# Patient Record
Sex: Female | Born: 1995 | Race: White | Hispanic: No | Marital: Single | Smoking: Never smoker
Health system: Southern US, Community
[De-identification: ages and names within clinical notes are randomized; demographics above are authoritative.]

## PROBLEM LIST (undated history)

## (undated) DIAGNOSIS — N63 Unspecified lump in unspecified breast: Secondary | ICD-10-CM

## (undated) HISTORY — PX: OTHER SURGICAL HISTORY: SHX169

---

## 2020-06-30 ENCOUNTER — Other Ambulatory Visit: Payer: Self-pay | Admitting: Obstetrics and Gynecology

## 2020-06-30 DIAGNOSIS — N6310 Unspecified lump in the right breast, unspecified quadrant: Secondary | ICD-10-CM

## 2020-07-13 ENCOUNTER — Other Ambulatory Visit: Payer: Self-pay

## 2020-07-13 ENCOUNTER — Ambulatory Visit
Admission: RE | Admit: 2020-07-13 | Discharge: 2020-07-13 | Disposition: A | Discharge status: Home / Self Care | Payer: Self-pay | Source: Ambulatory Visit | Attending: Obstetrics and Gynecology | Admitting: Obstetrics and Gynecology

## 2020-07-13 ENCOUNTER — Other Ambulatory Visit: Payer: Self-pay | Admitting: Obstetrics and Gynecology

## 2020-07-13 DIAGNOSIS — N6310 Unspecified lump in the right breast, unspecified quadrant: Secondary | ICD-10-CM

## 2021-01-07 ENCOUNTER — Other Ambulatory Visit: Payer: Self-pay | Admitting: Obstetrics and Gynecology

## 2021-01-07 DIAGNOSIS — N6310 Unspecified lump in the right breast, unspecified quadrant: Secondary | ICD-10-CM

## 2021-01-11 ENCOUNTER — Other Ambulatory Visit: Payer: Self-pay | Admitting: Obstetrics and Gynecology

## 2021-01-11 ENCOUNTER — Other Ambulatory Visit: Payer: Self-pay

## 2021-01-11 ENCOUNTER — Ambulatory Visit
Admission: RE | Admit: 2021-01-11 | Discharge: 2021-01-11 | Disposition: A | Discharge status: Home / Self Care | Payer: BC Managed Care – PPO | Source: Ambulatory Visit | Attending: Obstetrics and Gynecology | Admitting: Obstetrics and Gynecology

## 2021-01-11 DIAGNOSIS — N6310 Unspecified lump in the right breast, unspecified quadrant: Secondary | ICD-10-CM

## 2021-01-18 ENCOUNTER — Other Ambulatory Visit: Payer: Self-pay | Admitting: Obstetrics and Gynecology

## 2021-01-18 ENCOUNTER — Ambulatory Visit
Admission: RE | Admit: 2021-01-18 | Discharge: 2021-01-18 | Disposition: A | Discharge status: Home / Self Care | Payer: BC Managed Care – PPO | Source: Ambulatory Visit | Attending: Obstetrics and Gynecology | Admitting: Obstetrics and Gynecology

## 2021-01-18 ENCOUNTER — Other Ambulatory Visit: Payer: Self-pay

## 2021-01-18 DIAGNOSIS — N6310 Unspecified lump in the right breast, unspecified quadrant: Secondary | ICD-10-CM

## 2022-09-12 ENCOUNTER — Encounter: Payer: Self-pay | Admitting: Physical Therapy

## 2022-09-12 ENCOUNTER — Ambulatory Visit: Payer: BC Managed Care – PPO | Attending: Obstetrics and Gynecology | Admitting: Physical Therapy

## 2022-09-12 ENCOUNTER — Other Ambulatory Visit: Payer: Self-pay

## 2022-09-12 DIAGNOSIS — M62838 Other muscle spasm: Secondary | ICD-10-CM | POA: Diagnosis present

## 2022-09-12 DIAGNOSIS — M6281 Muscle weakness (generalized): Secondary | ICD-10-CM

## 2022-09-12 NOTE — Therapy (Addendum)
OUTPATIENT PHYSICAL THERAPY FEMALE PELVIC EVALUATION   Patient Name: Ashley Copeland MRN: [id number] DOB:March 20, 1996, 27 y.o., female Today's Date: 09/12/2022  END OF SESSION:  PT End of Session - 09/12/22 1800     Visit Number 1    Date for PT Re-Evaluation 12/05/22    Authorization Type BCBS    PT Start Time 1020    PT Stop Time 1058    PT Time Calculation (min) 38 min    Activity Tolerance Patient tolerated treatment well    Behavior During Therapy WFL for tasks assessed/performed             History reviewed. No pertinent past medical history. Past Surgical History:  Procedure Laterality Date   ureter surgery     from pt report   There are no problems to display for this patient.   PCP: Gara Kroner, DO   REFERRING PROVIDER: Tona Sensing, FNP   REFERRING DIAG: N94.10 (ICD-10-CM) - Unspecified dyspareunia   THERAPY DIAG:  Other muscle spasm  Muscle weakness (generalized)  Rationale for Evaluation and Treatment: Rehabilitation  ONSET DATE: years  SUBJECTIVE:                                                                                                                                                                                           SUBJECTIVE STATEMENT: Pretty much only having pain with intercourse and has been since I was a teen.  It doesn't happen every single time but usually have pain with intercourse and not sure why it happens. Fluid intake: Yes: a lot of water    PAIN:  Are you having pain? No NPRS scale: 7-8/10 Pain location: External, Deep, and Vaginal  Pain type: aching Pain description: intermittent   Aggravating factors: intercourse Relieving factors:   PRECAUTIONS: None  WEIGHT BEARING RESTRICTIONS: No  FALLS:  Has patient fallen in last 6 months? 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  PLOF: Independent  PATIENT GOALS: be able to  have intercourse without pain  PERTINENT HISTORY:  PMH: Surgery of ureter Sexual abuse: No  BOWEL MOVEMENT: Pain with bowel movement: No Type of bowel movement:Type (Bristol Stool Scale) normal, Frequency normal, and Strain No Fully empty rectum: Yes:     URINATION: Pain with urination: Yes Fully empty bladder: No recent feel like more frequently -last few months Stream: Strong Urgency: No Frequency: evenings when I lay down in bed sometimes have to get back up a few minutes later and feel like I have to keep going Leakage:  No Pads: No  INTERCOURSE: Pain with intercourse: Initial Penetration and During Penetration Ability to have vaginal penetration:  Yes:   Climax: able to have Marinoff Scale: 3/3 sometimes just don't want to  PREGNANCY: Nulliparous  PROLAPSE:    OBJECTIVE:   DIAGNOSTIC FINDINGS:    PATIENT SURVEYS:    PFIQ-7   COGNITION: Overall cognitive status: Within functional limits for tasks assessed     SENSATION: Light touch: Appears intact Proprioception: Appears intact  MUSCLE LENGTH: Hamstrings: Right 70 deg; Left 80 deg Thomas test:   LUMBAR SPECIAL TESTS:  ASLR negative  FUNCTIONAL TESTS:  Single leg stand Rt mild instability  GAIT:  Comments: WFL   POSTURE: rounded shoulders and increased thoracic kyphosis  PELVIC ALIGNMENT: normal  LUMBARAROM/PROM:  A/PROM A/PROM  eval  Flexion WFL   Extension   Right lateral flexion   Left lateral flexion   Right rotation   Left rotation    (Blank rows = not tested)  LOWER EXTREMITY ROM:  Passive ROM Right eval Left eval  Hip flexion 80%   Hip extension    Hip abduction    Hip adduction    Hip internal rotation 75% 100%  Hip external rotation 100% 100%  Knee flexion    Knee extension    Ankle dorsiflexion    Ankle plantarflexion    Ankle inversion    Ankle eversion     (Blank rows = not tested)  LOWER EXTREMITY MMT:  MMT Right eval Left eval  Hip flexion     Hip extension 4/5 4/5  Hip abduction 4/5 4/5  Hip adduction  4+/5  Hip internal rotation    Hip external rotation    Knee flexion    Knee extension    Ankle dorsiflexion    Ankle plantarflexion    Ankle inversion    Ankle eversion     PALPATION:   General  lumbar paraspinals and gluteals tight                External Perineal Exam elevated perineal body                             Internal Pelvic Floor high tone some tenderness Rt ileococcygeus and obturator, slow to relax and unable to relax all the way without heavy verbal and tactile cues  Patient confirms identification and approves PT to assess internal pelvic floor and treatment Yes  PELVIC MMT:   MMT eval  Vaginal 2/5 x 4 reps  Internal Anal Sphincter   External Anal Sphincter   Puborectalis   Diastasis Recti   (Blank rows = not tested)        TONE: high  PROLAPSE: no  TODAY'S TREATMENT:                                                                                                                              DATE: 09/12/22  EVAL and initial breathing and relaxing/bulging pelvic floor   PATIENT EDUCATION:  Education details:  Person educated: Patient Education method: Explanation Education comprehension: verbalized understanding  HOME EXERCISE PROGRAM: Not given due to slow internet connection  ASSESSMENT:  CLINICAL IMPRESSION: Patient is a 27 y.o. female who was seen today for physical therapy evaluation and treatment for dyspareunia.  Pt has difficulty relaxing the pelvic floor after contracting.  Pt can only contract 4 x with diminishing range of motion due to holding tension . Pt has tension in hamstrings, lumbar, gluteal muscles. Pt will benefit from skilled PT to address all above mentioned impairments for improved ability to manage pain during intercourse and not have pain during female wellness exams  OBJECTIVE IMPAIRMENTS: decreased coordination, decreased endurance, decreased ROM,  decreased strength, increased muscle spasms, impaired flexibility, and pain.   ACTIVITY LIMITATIONS:     PARTICIPATION LIMITATIONS: interpersonal relationship  PERSONAL FACTORS: Time since onset of injury/illness/exacerbation are also affecting patient's functional outcome.   REHAB POTENTIAL: Excellent  CLINICAL DECISION MAKING: Stable/uncomplicated  EVALUATION COMPLEXITY: Low   GOALS: Goals reviewed with patient? Yes    LONG TERM GOALS: Target date: 12/05/22  Pt will be independent with advanced HEP to maintain improvements made throughout therapy  Baseline:  Goal status: INITIAL  2.  Pt will report 1/3 Marinoff scale at most Baseline:  Goal status: INITIAL  3.  Pt will have at least one known way to significantly relax pelvic floor muscles after tightening in order to have low to zero pain during intercourse Baseline:  Goal status: INITIAL  4.  Pt will have 5/5 hip strength for improved pelvic stability during functional activities to prevent overuse of back and pelvic floor. Baseline:  Goal status: INITIAL  5.  Pt will have pain free intercourse at least 3/4 attempts Baseline:  Goal status: INITIAL    PLAN:  PT FREQUENCY: 1x/week  PT DURATION: 12 weeks  PLANNED INTERVENTIONS: Therapeutic exercises, Therapeutic activity, Neuromuscular re-education, Balance training, Gait training, Patient/Family education, Self Care, Joint mobilization, Dry Needling, Electrical stimulation, Cryotherapy, Moist heat, Taping, Biofeedback, Manual therapy, and Re-evaluation  PLAN FOR NEXT SESSION: biofeedback and education on dilators   Jule Ser, PT 09/12/2022, 6:02 PM  PHYSICAL THERAPY DISCHARGE SUMMARY  Visits from Start of Care: 1  Current functional level related to goals / functional outcomes: See above goals   Remaining deficits: See above   Education / Equipment: HEP   Patient agrees to discharge. Patient goals were not met. Patient is being discharged  due to financial reasons. Gustavus Bryant, PT, DPT 11/20/22 8:19 AM

## 2022-10-19 ENCOUNTER — Ambulatory Visit: Payer: BC Managed Care – PPO | Admitting: Physical Therapy

## 2022-10-27 ENCOUNTER — Ambulatory Visit: Payer: BC Managed Care – PPO | Admitting: Physical Therapy

## 2022-11-01 ENCOUNTER — Encounter: Payer: BC Managed Care – PPO | Admitting: Physical Therapy

## 2022-11-08 ENCOUNTER — Encounter: Payer: BC Managed Care – PPO | Admitting: Physical Therapy

## 2023-05-01 IMAGING — US US BREAST*R* LIMITED INC AXILLA
1 series · 9 of 9 positions shown · non-contrast
Comparison: 07/13/2020

CLINICAL DATA: Follow-up right breast probable benign fibroadenoma.

EXAM:
ULTRASOUND OF THE RIGHT BREAST

[Series 1: us breast*right* limited inc axilla · 0.06mm/px · 9 of 9 slices shown]
[im 1/9]
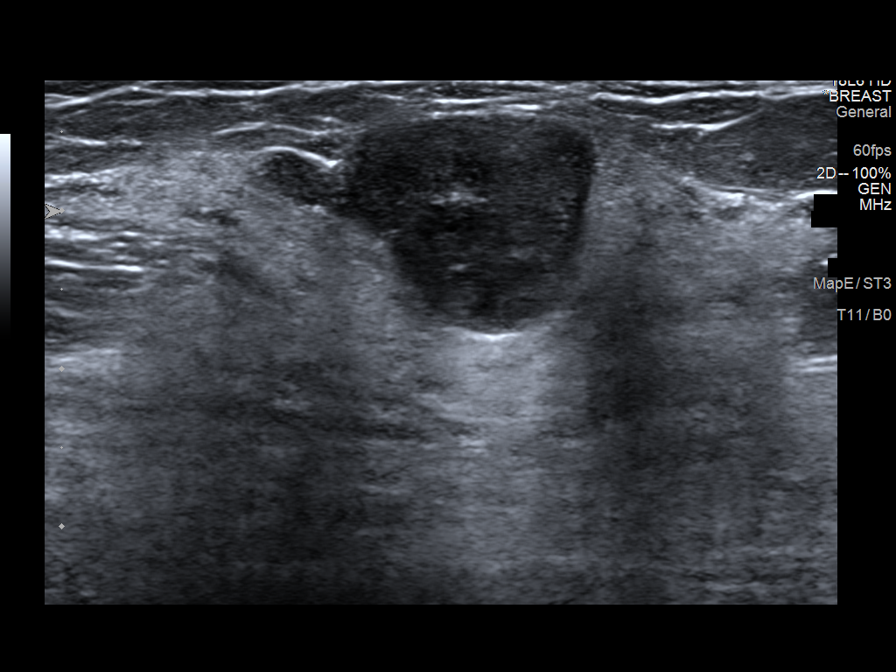
[im 2/9]
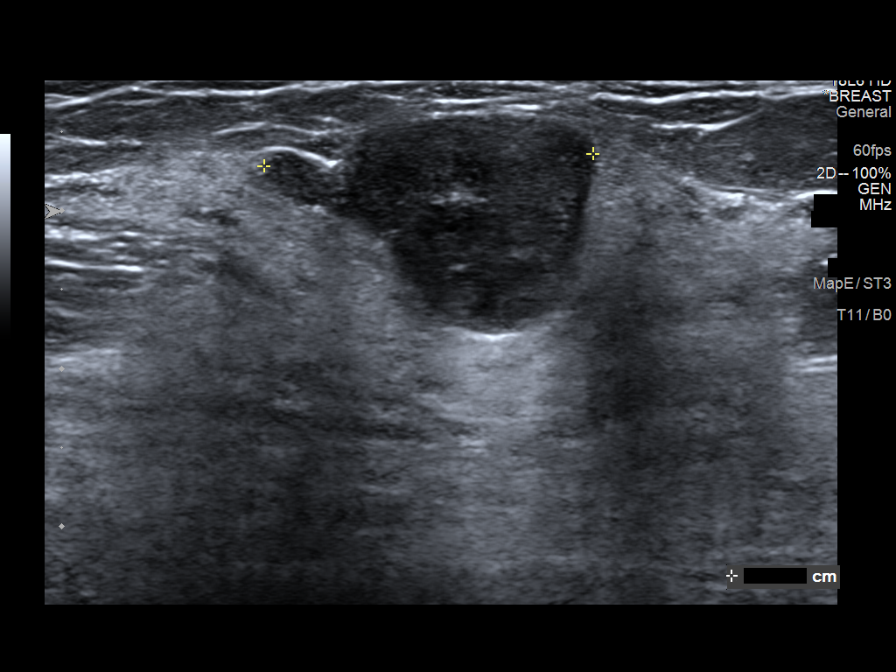
[im 3/9]
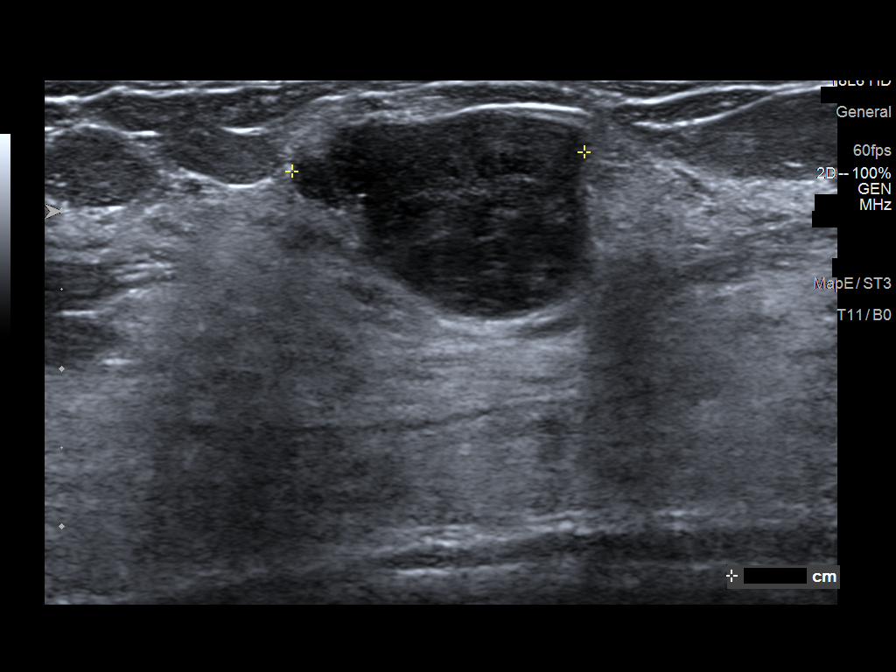
[im 4/9]
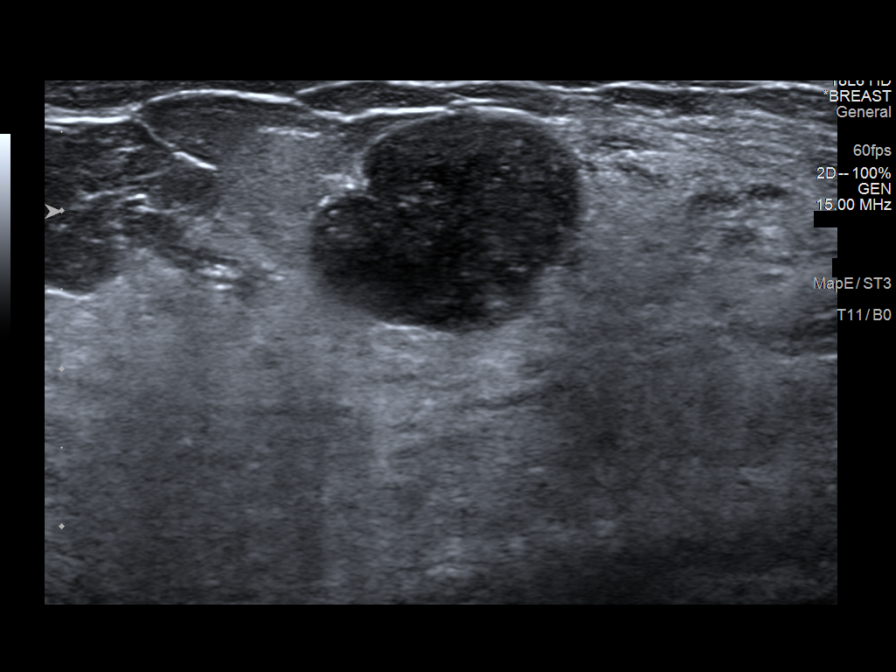
[im 5/9]
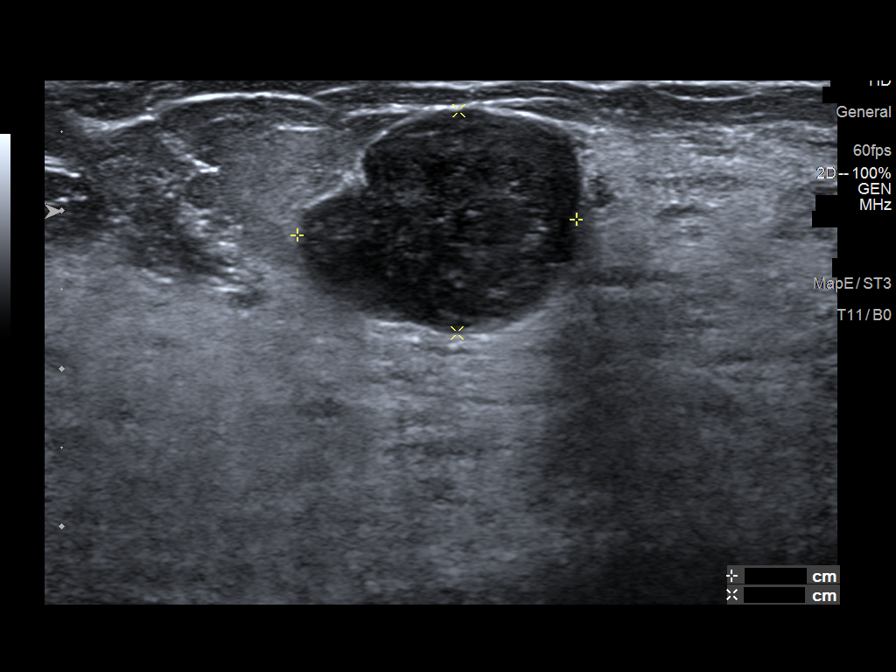
[im 6/9]
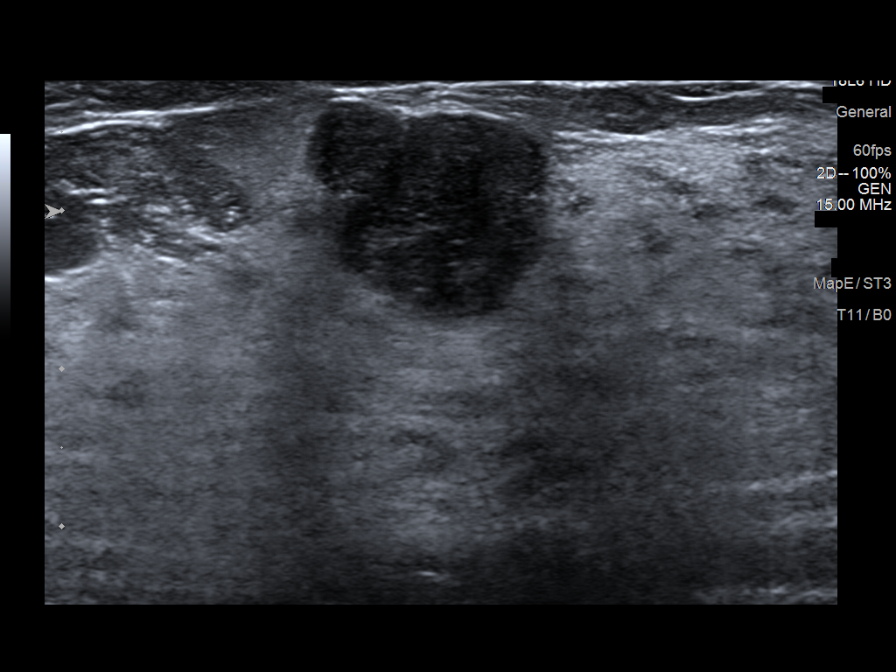
[im 7/9]
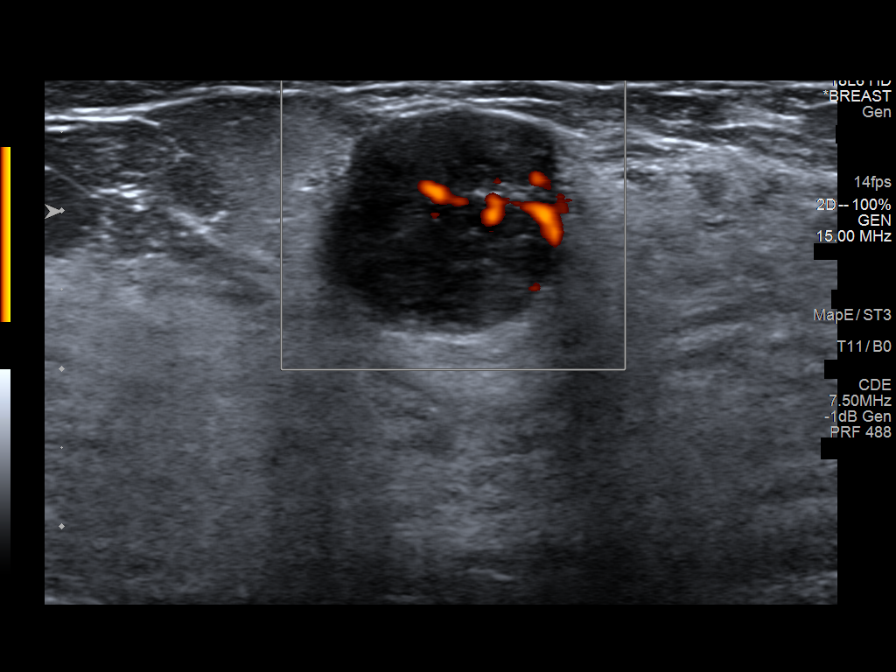
[im 8/9]
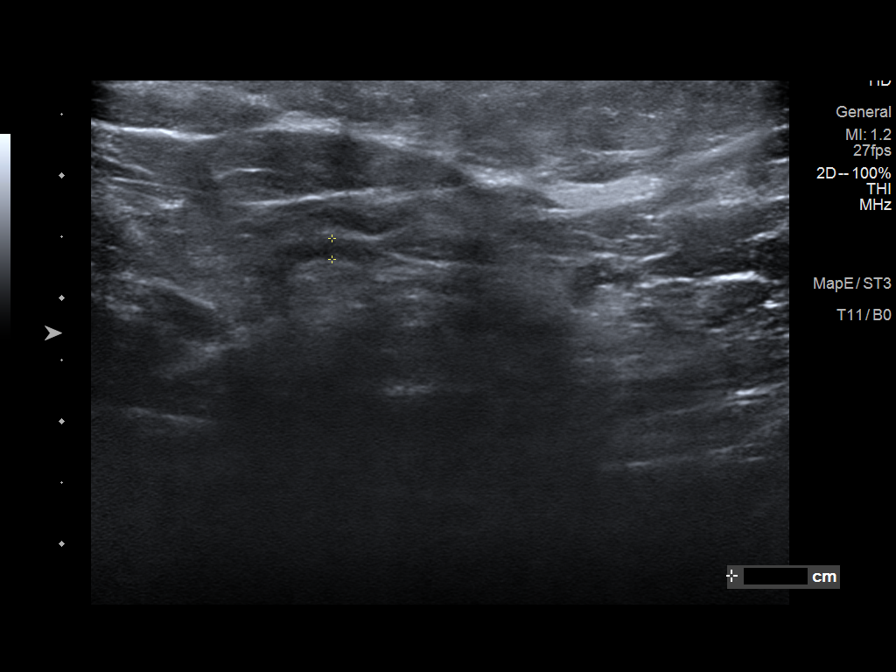
[im 9/9]
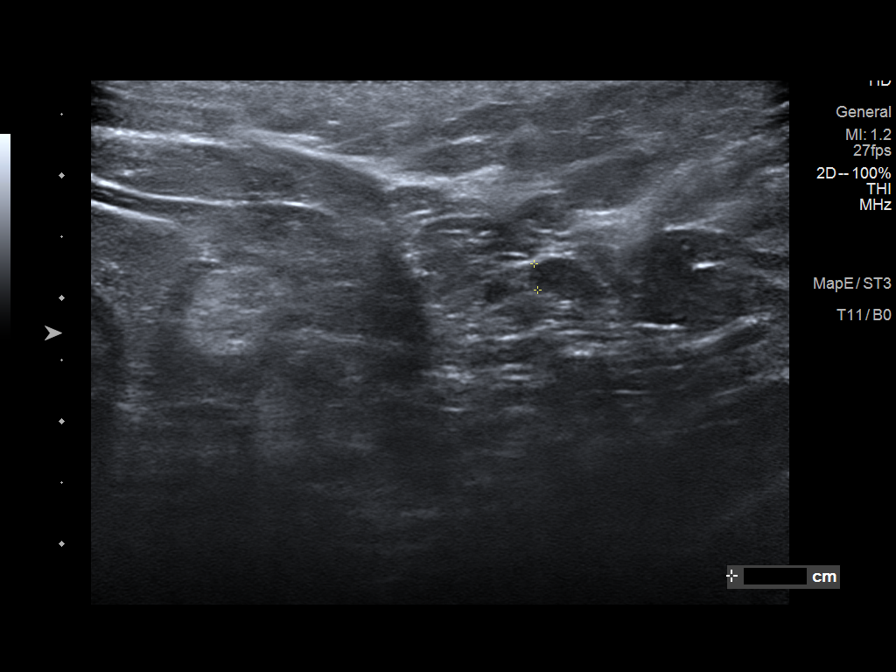

[9 of 9 positions shown; findings below may reference images not displayed]

FINDINGS: Targeted ultrasound is performed, showing a 2.1 x 1.8 x 1.4 cm oval,
horizontally oriented, circumscribed, hypoechoic mass in 9:30
o'clock position of the right breast. This measured 1.8 x 1.4 x
cm previously. This represents a 62% increase in volume.

Ultrasound of the right axilla demonstrated no right axillary
adenopathy.
IMPRESSION: Interval 62% increase in volume of the previously demonstrated
probable benign fibroadenoma 9:30 o'clock position of the right
breast. A phyllodes tumor cannot be excluded. Therefore,
ultrasound-guided core needle biopsy is recommended.

RECOMMENDATION:
Ultrasound-guided core needle biopsy of the 2.1 cm mass in the 9:30
o'clock position of the right breast. This has discussed with
patient and scheduled at [DATE] p.m. on 01/18/2021.

I have discussed the findings and recommendations with the patient.
If applicable, a reminder letter will be sent to the patient
regarding the next appointment.

BI-RADS CATEGORY  4: Suspicious.

## 2023-05-08 IMAGING — US US  BREAST BX W/ LOC DEV 1ST LESION IMG BX SPEC US GUIDE*R*
1 series · 10 of 10 positions shown · non-contrast
Comparison: Previous exam(s).
COMPARISON: Previous exam(s).

Addendum:
CLINICAL DATA: Patient with indeterminate right breast mass 9:30
o'clock.

EXAM:
ULTRASOUND GUIDED RIGHT BREAST CORE NEEDLE BIOPSY

[Series 1: us breast bx w/ loc dev 1st lesion img bx spec us  · 0.06mm/px · 10 of 10 slices shown]
[im 1/10]
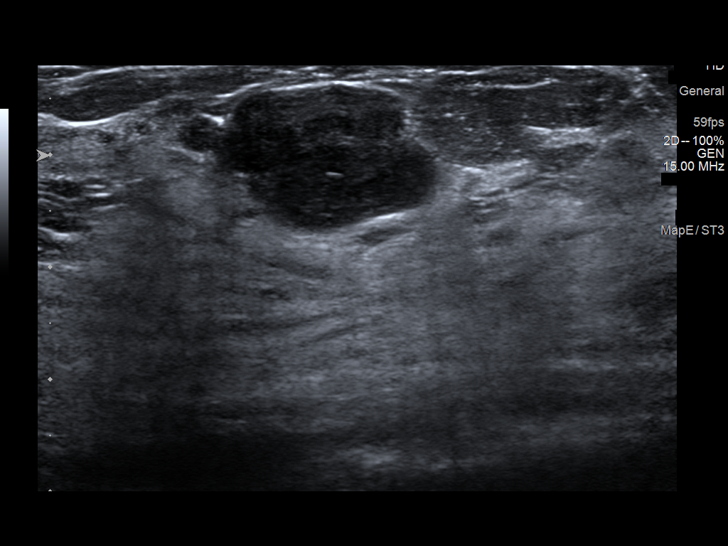
[im 2/10]
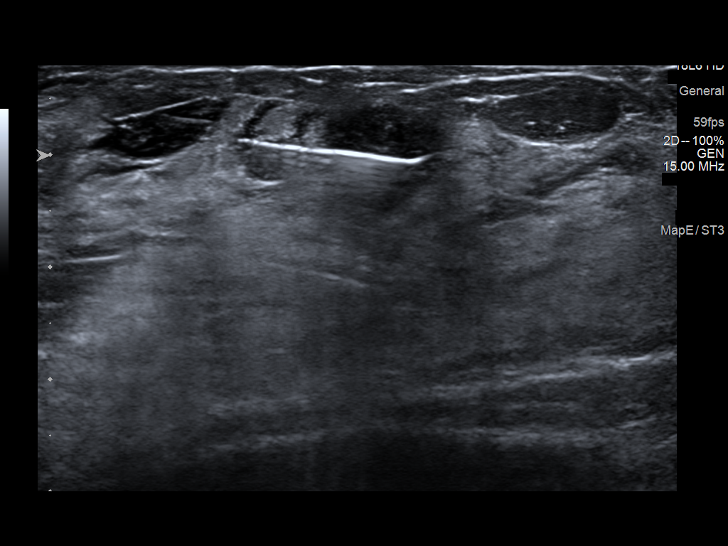
[im 3/10]
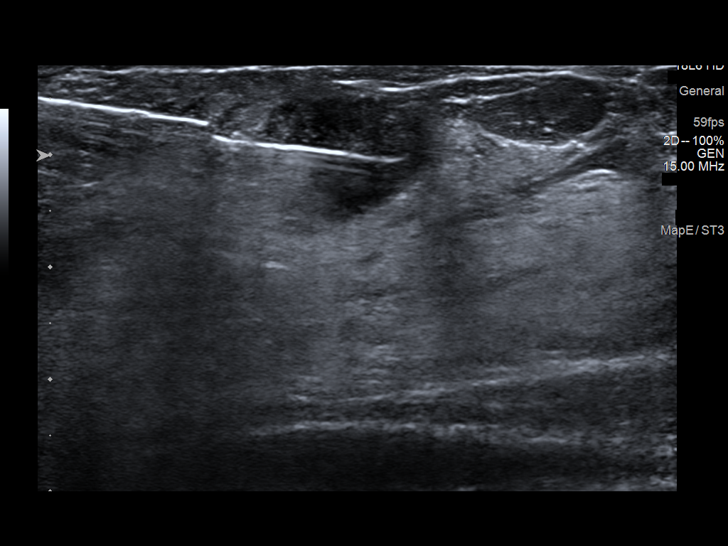
[im 4/10]
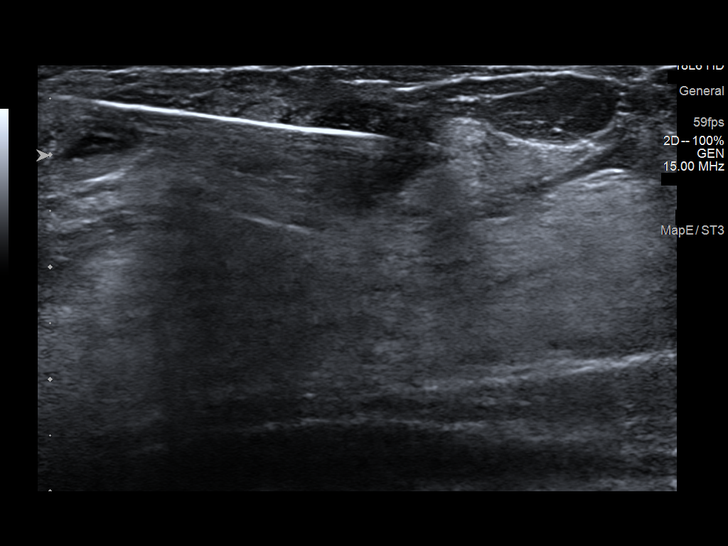
[im 5/10]
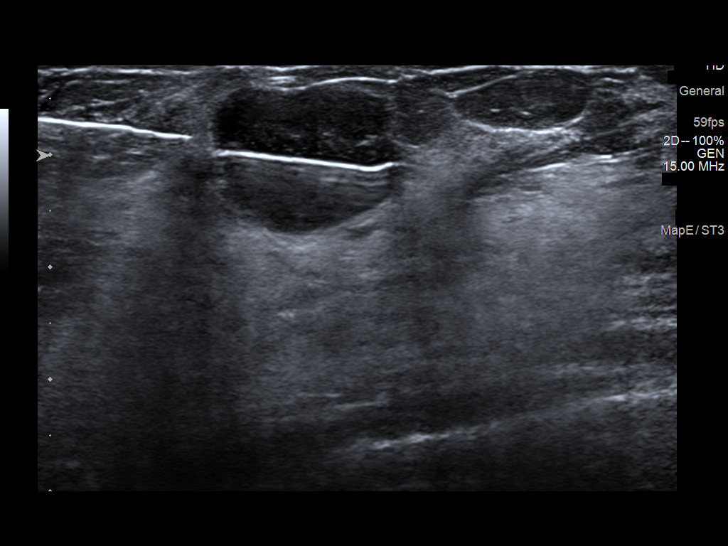
[im 6/10]
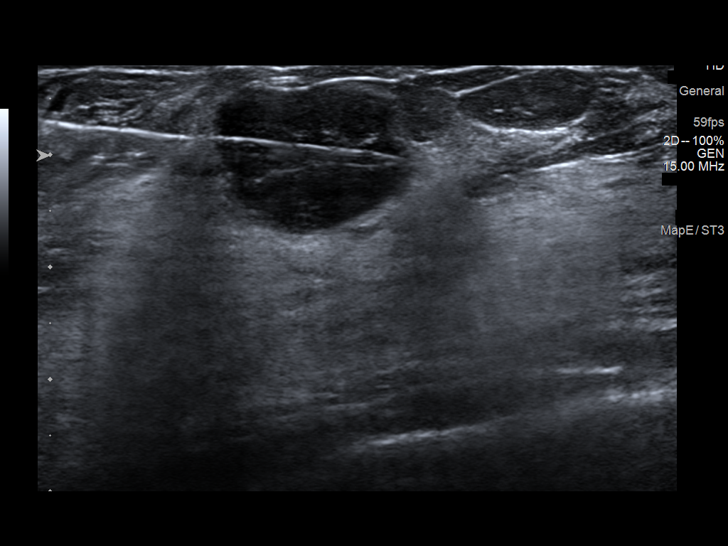
[im 7/10]
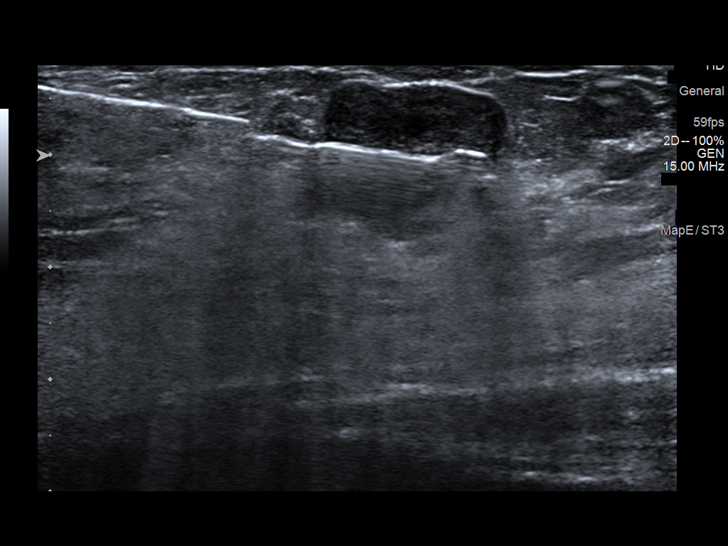
[im 8/10]
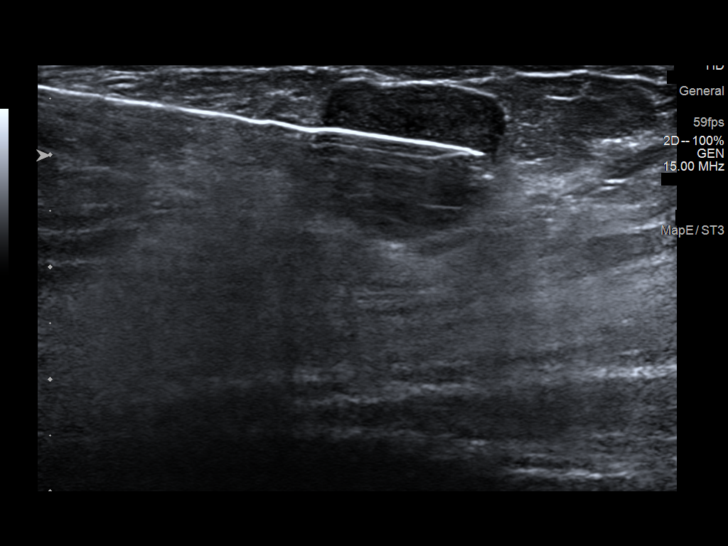
[im 9/10]
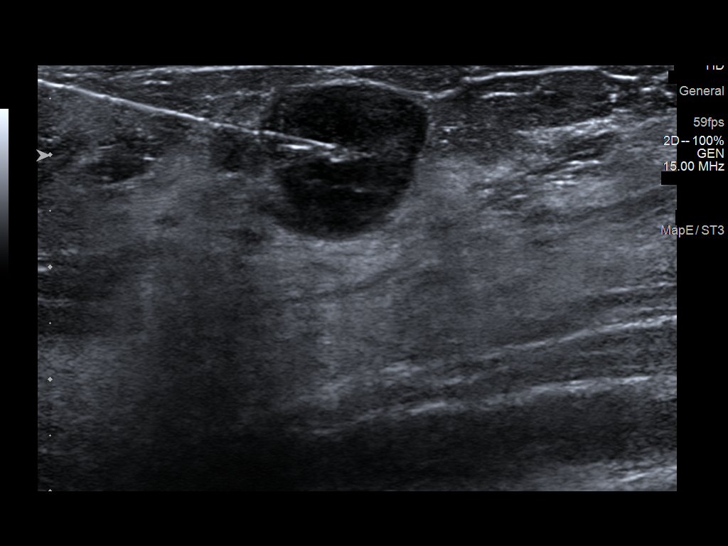
[im 10/10]
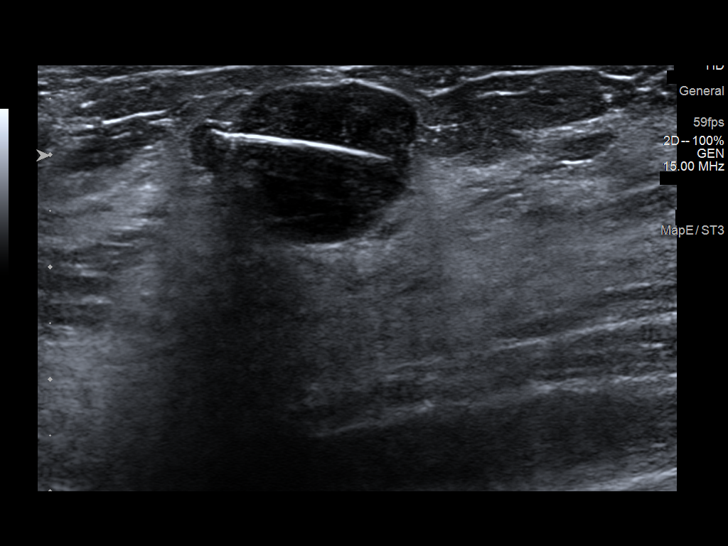

[10 of 10 positions shown; findings below may reference images not displayed]



Lesion quadrant: Upper outer quadrant

Using sterile technique and 1% Lidocaine as local anesthetic, under
direct ultrasound visualization, a 14 gauge Roedi device was
used to perform biopsy of right breast mass 9:30 o'clock using a
lateral approach. At the conclusion of the procedure tribell tissue
marker clip was deployed into the biopsy cavity.
IMPRESSION: Ultrasound guided biopsy of right breast mass 9:30 o'clock. No
apparent complications.

ADDENDUM:
Pathology revealed FIBROADENOMA of the Right breast, 9:30 o'clock.
This was found to be concordant by Dr. Arissa Billiot.

Pathology results were discussed with the patient by telephone. The
patient reported doing well after the biopsy with tenderness at the
site. Post biopsy instructions and care were reviewed and questions
were answered. The patient was encouraged to call The [REDACTED] for any additional concerns. My direct phone
number was provided.

The patient was instructed to continue with monthly self breast
examinations, clinical follow-up as needed, and to return for annual
mammography at 41, unless instructed otherwise.

Pathology results reported by Drey Jim, RN on 01/19/2021.



Lesion quadrant: Upper outer quadrant

Using sterile technique and 1% Lidocaine as local anesthetic, under
direct ultrasound visualization, a 14 gauge Roedi device was
used to perform biopsy of right breast mass 9:30 o'clock using a
lateral approach. At the conclusion of the procedure tribell tissue
marker clip was deployed into the biopsy cavity.
IMPRESSION: Ultrasound guided biopsy of right breast mass 9:30 o'clock. No
apparent complications.

## 2023-08-13 ENCOUNTER — Other Ambulatory Visit: Payer: Self-pay | Admitting: Obstetrics and Gynecology

## 2023-08-13 DIAGNOSIS — N6310 Unspecified lump in the right breast, unspecified quadrant: Secondary | ICD-10-CM

## 2023-09-05 ENCOUNTER — Ambulatory Visit: Payer: 59

## 2023-09-19 ENCOUNTER — Other Ambulatory Visit: Payer: 59

## 2023-10-03 ENCOUNTER — Ambulatory Visit
Admission: RE | Admit: 2023-10-03 | Discharge: 2023-10-03 | Disposition: A | Discharge status: Home / Self Care | Payer: 59 | Source: Ambulatory Visit | Attending: Obstetrics and Gynecology | Admitting: Obstetrics and Gynecology

## 2023-10-03 DIAGNOSIS — N6310 Unspecified lump in the right breast, unspecified quadrant: Secondary | ICD-10-CM

## 2023-10-22 ENCOUNTER — Other Ambulatory Visit: Payer: Self-pay | Admitting: General Surgery

## 2023-11-01 ENCOUNTER — Other Ambulatory Visit: Payer: Self-pay

## 2023-11-01 ENCOUNTER — Encounter (HOSPITAL_BASED_OUTPATIENT_CLINIC_OR_DEPARTMENT_OTHER): Payer: Self-pay | Admitting: General Surgery

## 2023-11-07 MED ORDER — CHLORHEXIDINE GLUCONATE CLOTH 2 % EX PADS: 6.0000 | MEDICATED_PAD | Freq: Once | CUTANEOUS | Status: DC

## 2023-11-07 NOTE — Progress Notes (Signed)

## 2023-11-08 ENCOUNTER — Encounter (HOSPITAL_BASED_OUTPATIENT_CLINIC_OR_DEPARTMENT_OTHER)
Admission: RE | Disposition: A | Discharge status: Home / Self Care | Payer: Self-pay | Source: Ambulatory Visit | Attending: General Surgery

## 2023-11-08 ENCOUNTER — Encounter (HOSPITAL_BASED_OUTPATIENT_CLINIC_OR_DEPARTMENT_OTHER): Payer: Self-pay | Admitting: General Surgery

## 2023-11-08 ENCOUNTER — Other Ambulatory Visit: Payer: Self-pay

## 2023-11-08 ENCOUNTER — Ambulatory Visit (HOSPITAL_BASED_OUTPATIENT_CLINIC_OR_DEPARTMENT_OTHER): Admitting: Anesthesiology

## 2023-11-08 ENCOUNTER — Ambulatory Visit (HOSPITAL_BASED_OUTPATIENT_CLINIC_OR_DEPARTMENT_OTHER)
Admission: RE | Admit: 2023-11-08 | Discharge: 2023-11-08 | Disposition: A | Discharge status: Home / Self Care | Payer: 59 | Source: Ambulatory Visit | Attending: General Surgery | Admitting: General Surgery

## 2023-11-08 DIAGNOSIS — Z8043 Family history of malignant neoplasm of testis: Secondary | ICD-10-CM | POA: Insufficient documentation

## 2023-11-08 DIAGNOSIS — N6310 Unspecified lump in the right breast, unspecified quadrant: Secondary | ICD-10-CM | POA: Diagnosis not present

## 2023-11-08 DIAGNOSIS — D241 Benign neoplasm of right breast: Secondary | ICD-10-CM | POA: Insufficient documentation

## 2023-11-08 DIAGNOSIS — Z01818 Encounter for other preprocedural examination: Secondary | ICD-10-CM

## 2023-11-08 HISTORY — PX: BREAST CYST EXCISION: SHX579

## 2023-11-08 HISTORY — DX: Unspecified lump in unspecified breast: N63.0

## 2023-11-08 LAB — POCT PREGNANCY, URINE: Preg Test, Ur: NEGATIVE

## 2023-11-08 SURGERY — EXCISION, CYST, BREAST
Anesthesia: General | Site: Breast | Laterality: Right

## 2023-11-08 MED ORDER — MIDAZOLAM HCL 2 MG/2ML IJ SOLN
INTRAMUSCULAR | Status: AC
  Filled 2023-11-08: qty 2

## 2023-11-08 MED ORDER — SCOPOLAMINE 1 MG/3DAYS TD PT72
1.0000 | MEDICATED_PATCH | Freq: Once | TRANSDERMAL | Status: DC
  Administered 2023-11-08: 1.5 mg via TRANSDERMAL

## 2023-11-08 MED ORDER — DEXAMETHASONE SODIUM PHOSPHATE 10 MG/ML IJ SOLN
INTRAMUSCULAR | Status: AC
  Filled 2023-11-08: qty 1

## 2023-11-08 MED ORDER — DEXAMETHASONE SODIUM PHOSPHATE 10 MG/ML IJ SOLN
INTRAMUSCULAR | Status: DC | PRN
  Administered 2023-11-08: 4 mg via INTRAVENOUS

## 2023-11-08 MED ORDER — LIDOCAINE HCL (PF) 1 % IJ SOLN
INTRAMUSCULAR | Status: AC
  Filled 2023-11-08: qty 30

## 2023-11-08 MED ORDER — PROPOFOL 500 MG/50ML IV EMUL
INTRAVENOUS | Status: DC | PRN
  Administered 2023-11-08: 200 ug/kg/min via INTRAVENOUS

## 2023-11-08 MED ORDER — LACTATED RINGERS IV SOLN: INTRAVENOUS | Status: DC

## 2023-11-08 MED ORDER — CEFAZOLIN SODIUM-DEXTROSE 2-4 GM/100ML-% IV SOLN
INTRAVENOUS | Status: AC
Start: 2023-11-08 — End: ?
  Filled 2023-11-08: qty 100

## 2023-11-08 MED ORDER — LIDOCAINE 2% (20 MG/ML) 5 ML SYRINGE
INTRAMUSCULAR | Status: AC
  Filled 2023-11-08: qty 5

## 2023-11-08 MED ORDER — BUPIVACAINE-EPINEPHRINE (PF) 0.25% -1:200000 IJ SOLN
INTRAMUSCULAR | Status: DC | PRN
  Administered 2023-11-08: 25 mL

## 2023-11-08 MED ORDER — LIDOCAINE 2% (20 MG/ML) 5 ML SYRINGE
INTRAMUSCULAR | Status: DC | PRN
Start: 2023-11-08 — End: 2023-11-08
  Administered 2023-11-08: 100 mg via INTRAVENOUS

## 2023-11-08 MED ORDER — PROPOFOL 10 MG/ML IV BOLUS
INTRAVENOUS | Status: DC | PRN
  Administered 2023-11-08: 30 mg via INTRAVENOUS
  Administered 2023-11-08: 170 mg via INTRAVENOUS

## 2023-11-08 MED ORDER — OXYCODONE HCL 5 MG PO TABS: 5.0000 mg | ORAL_TABLET | Freq: Once | ORAL | Status: DC | PRN

## 2023-11-08 MED ORDER — SCOPOLAMINE 1 MG/3DAYS TD PT72
MEDICATED_PATCH | TRANSDERMAL | Status: AC
  Filled 2023-11-08: qty 1

## 2023-11-08 MED ORDER — ONDANSETRON HCL 4 MG/2ML IJ SOLN
INTRAMUSCULAR | Status: DC | PRN
  Administered 2023-11-08: 4 mg via INTRAVENOUS

## 2023-11-08 MED ORDER — FENTANYL CITRATE (PF) 100 MCG/2ML IJ SOLN
INTRAMUSCULAR | Status: AC
  Filled 2023-11-08: qty 2

## 2023-11-08 MED ORDER — MIDAZOLAM HCL 5 MG/5ML IJ SOLN
INTRAMUSCULAR | Status: DC | PRN
  Administered 2023-11-08: 2 mg via INTRAVENOUS

## 2023-11-08 MED ORDER — ONDANSETRON HCL 4 MG/2ML IJ SOLN
INTRAMUSCULAR | Status: AC
  Filled 2023-11-08: qty 2

## 2023-11-08 MED ORDER — FENTANYL CITRATE (PF) 100 MCG/2ML IJ SOLN
INTRAMUSCULAR | Status: DC | PRN
  Administered 2023-11-08 (×2): 50 ug via INTRAVENOUS

## 2023-11-08 MED ORDER — LIDOCAINE HCL (PF) 1 % IJ SOLN
INTRAMUSCULAR | Status: DC | PRN
Start: 2023-11-08 — End: 2023-11-08
  Administered 2023-11-08: 25 mL

## 2023-11-08 MED ORDER — DEXMEDETOMIDINE HCL IN NACL 80 MCG/20ML IV SOLN
INTRAVENOUS | Status: DC | PRN
  Administered 2023-11-08 (×2): 4 ug via INTRAVENOUS

## 2023-11-08 MED ORDER — BUPIVACAINE-EPINEPHRINE (PF) 0.25% -1:200000 IJ SOLN
INTRAMUSCULAR | Status: AC
  Filled 2023-11-08: qty 30

## 2023-11-08 MED ORDER — FENTANYL CITRATE (PF) 100 MCG/2ML IJ SOLN: 25.0000 ug | INTRAMUSCULAR | Status: DC | PRN

## 2023-11-08 MED ORDER — OXYCODONE HCL 5 MG/5ML PO SOLN: 5.0000 mg | Freq: Once | ORAL | Status: DC | PRN

## 2023-11-08 MED ORDER — ACETAMINOPHEN 500 MG PO TABS
1000.0000 mg | ORAL_TABLET | ORAL | Status: AC
  Administered 2023-11-08: 1000 mg via ORAL

## 2023-11-08 MED ORDER — ACETAMINOPHEN 500 MG PO TABS
ORAL_TABLET | ORAL | Status: AC
  Filled 2023-11-08: qty 2

## 2023-11-08 MED ORDER — OXYCODONE HCL 5 MG PO TABS
5.0000 mg | ORAL_TABLET | Freq: Four times a day (QID) | ORAL | 0 refills | Status: AC | PRN
Start: 2023-11-08 — End: ?

## 2023-11-08 MED ORDER — DROPERIDOL 2.5 MG/ML IJ SOLN: 0.6250 mg | Freq: Once | INTRAMUSCULAR | Status: DC | PRN

## 2023-11-08 MED ORDER — CEFAZOLIN SODIUM-DEXTROSE 2-4 GM/100ML-% IV SOLN
2.0000 g | INTRAVENOUS | Status: AC
  Administered 2023-11-08: 2 g via INTRAVENOUS

## 2023-11-08 SURGICAL SUPPLY — 49 items
BINDER BREAST LRG (GAUZE/BANDAGES/DRESSINGS) IMPLANT
BINDER BREAST MEDIUM (GAUZE/BANDAGES/DRESSINGS) IMPLANT
BINDER BREAST XLRG (GAUZE/BANDAGES/DRESSINGS) IMPLANT
BINDER BREAST XXLRG (GAUZE/BANDAGES/DRESSINGS) IMPLANT
BLADE SURG 10 STRL SS (BLADE) ×1 IMPLANT
BLADE SURG 15 STRL LF DISP TIS (BLADE) ×1 IMPLANT
CANISTER SUCT 1200ML W/VALVE (MISCELLANEOUS) ×1 IMPLANT
CHLORAPREP W/TINT 26 (MISCELLANEOUS) ×1 IMPLANT
CLIP TI LARGE 6 (CLIP) IMPLANT
COVER BACK TABLE 60X90IN (DRAPES) ×1 IMPLANT
COVER MAYO STAND STRL (DRAPES) ×1 IMPLANT
DERMABOND ADVANCED .7 DNX12 (GAUZE/BANDAGES/DRESSINGS) ×1 IMPLANT
DRAPE LAPAROTOMY 100X72 PEDS (DRAPES) ×1 IMPLANT
DRAPE UTILITY XL STRL (DRAPES) ×1 IMPLANT
ELECT BLADE 4.0 EZ CLEAN MEGAD (MISCELLANEOUS) ×1 IMPLANT
ELECT REM PT RETURN 9FT ADLT (ELECTROSURGICAL) ×1 IMPLANT
ELECTRODE BLDE 4.0 EZ CLN MEGD (MISCELLANEOUS) IMPLANT
ELECTRODE REM PT RTRN 9FT ADLT (ELECTROSURGICAL) ×1 IMPLANT
GAUZE PAD ABD 8X10 STRL (GAUZE/BANDAGES/DRESSINGS) IMPLANT
GAUZE SPONGE 4X4 12PLY STRL LF (GAUZE/BANDAGES/DRESSINGS) ×1 IMPLANT
GLOVE BIO SURGEON STRL SZ 6 (GLOVE) ×1 IMPLANT
GLOVE BIO SURGEON STRL SZ7 (GLOVE) IMPLANT
GLOVE BIOGEL PI IND STRL 6.5 (GLOVE) ×1 IMPLANT
GLOVE BIOGEL PI IND STRL 7.0 (GLOVE) IMPLANT
GLOVE BIOGEL PI IND STRL 7.5 (GLOVE) IMPLANT
GOWN STRL REUS W/ TWL LRG LVL3 (GOWN DISPOSABLE) ×1 IMPLANT
GOWN STRL REUS W/ TWL XL LVL3 (GOWN DISPOSABLE) ×1 IMPLANT
KIT MARKER MARGIN INK (KITS) IMPLANT
LIGHT WAVEGUIDE WIDE FLAT (MISCELLANEOUS) IMPLANT
NDL HYPO 25X1 1.5 SAFETY (NEEDLE) ×1 IMPLANT
NEEDLE HYPO 25X1 1.5 SAFETY (NEEDLE) ×1 IMPLANT
NS IRRIG 1000ML POUR BTL (IV SOLUTION) ×1 IMPLANT
PACK BASIN DAY SURGERY FS (CUSTOM PROCEDURE TRAY) ×1 IMPLANT
PENCIL SMOKE EVACUATOR (MISCELLANEOUS) ×1 IMPLANT
SLEEVE SCD COMPRESS KNEE MED (STOCKING) ×1 IMPLANT
SPONGE T-LAP 18X18 ~~LOC~~+RFID (SPONGE) ×1 IMPLANT
STAPLER SKIN PROX WIDE 3.9 (STAPLE) IMPLANT
STRIP CLOSURE SKIN 1/2X4 (GAUZE/BANDAGES/DRESSINGS) ×1 IMPLANT
SUT MON AB 4-0 PC3 18 (SUTURE) ×1 IMPLANT
SUT SILK 2 0 SH (SUTURE) IMPLANT
SUT VIC AB 2-0 SH 27XBRD (SUTURE) IMPLANT
SUT VIC AB 3-0 54X BRD REEL (SUTURE) IMPLANT
SUT VIC AB 3-0 SH 27X BRD (SUTURE) ×1 IMPLANT
SYR BULB EAR ULCER 3OZ GRN STR (SYRINGE) ×1 IMPLANT
SYR CONTROL 10ML LL (SYRINGE) ×1 IMPLANT
TOWEL GREEN STERILE FF (TOWEL DISPOSABLE) ×1 IMPLANT
TRAY FAXITRON CT DISP (TRAY / TRAY PROCEDURE) IMPLANT
TUBE CONNECTING 20X1/4 (TUBING) ×1 IMPLANT
YANKAUER SUCT BULB TIP NO VENT (SUCTIONS) ×1 IMPLANT

## 2023-11-08 NOTE — Interval H&P Note (Signed)
 History and Physical Interval Note:  11/08/2023 8:46 AM  Ashley Copeland  has presented today for surgery, with the diagnosis of RIGHT BREAST MASS.  The various methods of treatment have been discussed with the patient and family. After consideration of risks, benefits and other options for treatment, the patient has consented to  Procedure(s): EXCISION OF RIGHT BREAST MASS (Right) as a surgical intervention.  The patient's history has been reviewed, patient examined, no change in status, stable for surgery.  I have reviewed the patient's chart and labs.  Questions were answered to the patient's satisfaction.     [name]

## 2023-11-08 NOTE — Discharge Instructions (Addendum)
 [facility] Office Phone Number [phone]  BREAST BIOPSY/ PARTIAL MASTECTOMY: POST OP INSTRUCTIONS  Always review your discharge instruction sheet given to you by the facility where your surgery was performed.  IF YOU HAVE DISABILITY OR FAMILY LEAVE FORMS, YOU MUST BRING THEM TO THE OFFICE FOR PROCESSING.  DO NOT GIVE THEM TO YOUR DOCTOR.  Take 2 tylenol (acetominophen) three times a day for 3 days.  If you still have pain, add ibuprofen with food in between if able to take this (if you have kidney issues or stomach issues, do not take ibuprofen).  If both of those are not enough, add the narcotic pain pill.  If you find you are needing a lot of this overnight after surgery, call the next morning for a refill.    Prescriptions will not be filled after 5pm or on week-ends. Take your usually prescribed medications unless otherwise directed You should eat very light the first 24 hours after surgery, such as soup, crackers, pudding, etc.  Resume your normal diet the day after surgery. Most patients will experience some swelling and bruising in the breast.  Ice packs and a good support bra will help.  Swelling and bruising can take several days to resolve.  It is common to experience some constipation if taking pain medication after surgery.  Increasing fluid intake and taking a stool softener will usually help or prevent this problem from occurring.  A mild laxative (Milk of Magnesia or Miralax) should be taken according to package directions if there are no bowel movements after 48 hours. Unless discharge instructions indicate otherwise, you may remove your bandages 48 hours after surgery, and you may shower at that time.  You may have steri-strips (small skin tapes) in place directly over the incision.  These strips should be left on the skin at least for for 7-10 days.    ACTIVITIES:  You may resume regular daily activities (gradually increasing) beginning the next day.  Wearing a  good support bra or sports bra (or the breast binder) minimizes pain and swelling.  You may have sexual intercourse when it is comfortable. No heavy lifting for 1-2 weeks (not over around 10 pounds).  You may drive when you no longer are taking prescription pain medication, you can comfortably wear a seatbelt, and you can safely maneuver your car and apply brakes. RETURN TO WORK:  __________3-14 days depending on job. _______________ Bonita Quin should see your doctor in the office for a follow-up appointment approximately two weeks after your surgery.  Your doctor's nurse will typically make your follow-up appointment when she calls you with your pathology report.  Expect your pathology report 3-4 business days after your surgery.  You may call to check if you do not hear from [place] after three days.   WHEN TO CALL YOUR DOCTOR: Fever over 101.0 Nausea and/or vomiting. Extreme swelling or bruising. Continued bleeding from incision. Increased pain, redness, or drainage from the incision.  The clinic staff is available to answer your questions during regular business hours.  Please don't hesitate to call and ask to speak to one of the nurses for clinical concerns.  If you have a medical emergency, go to the nearest emergency room or call 911.  A surgeon from [facility] is always on call at the hospital.  For further questions, please visit [email or web address]   Last dose of Tylenol given at 8am  Post Anesthesia Home Care Instructions  Activity: Get plenty of rest for the remainder of  the day. A responsible individual must stay with you for 24 hours following the procedure.  For the next 24 hours, DO NOT: -Drive a car -Advertising copywriter -Drink alcoholic beverages -Take any medication unless instructed by your physician -Make any legal decisions or sign important papers.  Meals: Start with liquid foods such as gelatin or soup. Progress to regular foods as tolerated. Avoid  greasy, spicy, heavy foods. If nausea and/or vomiting occur, drink only clear liquids until the nausea and/or vomiting subsides. Call your physician if vomiting continues.  Special Instructions/Symptoms: Your throat may feel dry or sore from the anesthesia or the breathing tube placed in your throat during surgery. If this causes discomfort, gargle with warm salt water. The discomfort should disappear within 24 hours.  If you had a scopolamine patch placed behind your ear for the management of post- operative nausea and/or vomiting:  1. The medication in the patch is effective for 72 hours, after which it should be removed.  Wrap patch in a tissue and discard in the trash. Wash hands thoroughly with soap and water. 2. You may remove the patch earlier than 72 hours if you experience unpleasant side effects which may include dry mouth, dizziness or visual disturbances. 3. Avoid touching the patch. Wash your hands with soap and water after contact with the patch.

## 2023-11-08 NOTE — Anesthesia Postprocedure Evaluation (Signed)
 Anesthesia Post Note  Patient: Ashley Copeland  Procedure(s) Performed: EXCISION OF RIGHT BREAST MASS (Right: Breast)     Patient location during evaluation: PACU Anesthesia Type: General Level of consciousness: awake and alert Pain management: pain level controlled Vital Signs Assessment: post-procedure vital signs reviewed and stable Respiratory status: spontaneous breathing, nonlabored ventilation and respiratory function stable Cardiovascular status: blood pressure returned to baseline Postop Assessment: no apparent nausea or vomiting Anesthetic complications: no   No notable events documented.  Last Vitals:  Vitals:   11/08/23 1215 11/08/23 1237  BP: (!) 123/95 121/85  Pulse: 78 75  Resp: 12 16  Temp:  (!) 36.2 C  SpO2: 99% 98%    Last Pain:  Vitals:   11/08/23 1237  TempSrc:   PainSc: 0-No pain                 Shanda Howells

## 2023-11-08 NOTE — Anesthesia Procedure Notes (Addendum)
 Procedure Name: LMA Insertion Date/Time: 11/08/2023 10:07 AM  Performed by: Burna Cash, CRNAPre-anesthesia Checklist: Patient identified, Emergency Drugs available, Suction available and Patient being monitored Patient Re-evaluated:Patient Re-evaluated prior to induction Oxygen Delivery Method: Circle system utilized Preoxygenation: Pre-oxygenation with 100% oxygen Induction Type: IV induction Ventilation: Mask ventilation without difficulty LMA: LMA inserted LMA Size: 4.0 Number of attempts: 1 Airway Equipment and Method: Bite block Placement Confirmation: positive ETCO2 Tube secured with: Tape Dental Injury: Teeth and Oropharynx as per pre-operative assessment

## 2023-11-08 NOTE — Op Note (Signed)
 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  Pre-operative Diagnosis: right breast mass  Post-operative Diagnosis: right breast mass  Surgeon: [name]   Asst:  Gevena Cotton, RNFA  Anesthesia: General LMA anesthesia  ASA Class: 1  Procedure Details  The patient was seen in the Holding Room. The risks, benefits, complications, treatment options, and expected outcomes were discussed with the patient. The possibilities of reaction to medication, pulmonary aspiration, bleeding, infection, the need for additional procedures, failure to diagnose a condition, and creating a complication requiring transfusion or operation were discussed with the patient. The patient concurred with the proposed plan, giving informed consent.  The site of surgery properly noted/marked. The patient was taken to Operating Room # 8, identified, and the procedure verified as Breast Excisional Biopsy. A Time Out was held and the above information confirmed.  After induction of anesthesia, the right  breast and chest were prepped and draped in standard fashion. The lumpectomy was performed by creating a transverse laterally oriented incision over the breast mass.  Dissection was carried down around the mass incorporating a small margin with the cautery.  The specimen was marked with the margin marker paint kit.  The specimen was placed in the faxatron to confirm the biopsy clip. Hemostasis was achieved with cautery.  The wound was irrigated.  The deeper tissue was reapproximated with interrupted 2-0 vicryl sutures.  Local was infiltrated around the skin and the cavity.  The skin was then closed with 3-0 Vicryl interrupted deep dermal sutures and a 4-0 Monocryl subcuticular closure in layers.    Sterile dressings were applied. At the end of the operation, all sponge, instrument, and needle counts were  correct.  Findings: Grossly clear surgical margins and 4 x 2.5 cm mass  Estimated Blood Loss:  Minimal               Specimens: right breast mass to pathology         Complications:  None; patient tolerated the procedure well.         Disposition: PACU - hemodynamically stable.         Condition: stable

## 2023-11-08 NOTE — H&P (Signed)
 REFERRING PHYSICIAN: Darlis Loan  PROVIDER: Matthias Hughs, MD  Care Team: Patient Care Team: Ashley Copeland, [place], DO as PCP - General (Family Medicine)   MRN: [id number] DOB: 1996-04-27 DATE OF ENCOUNTER: 10/22/2023  Subjective   Chief Complaint: New Consultation ( Progressively enlarging Fibroadenoma)   History of Present Illness: Ashley Copeland is a 28 y.o. female who is seen today as an office consultation at the request of Dr. Mayford Knife for evaluation of New Consultation ( Progressively enlarging Fibroadenoma)  History of Present Illness Ashley Copeland, a young attorney, presents with a breast mass that was discovered by her OB in 2021. The mass has since grown in size and is causing discomfort, particularly when lying on her side or carrying something. The discomfort is not severe but is noticeable due to the increased size of the mass. There has been no change in her menstrual cycle. [name] does not have a significant family history of cancer. Her father has had testicular cancer, but there is no known history of breast, uterine, ovarian, colon, or stomach cancer in the family.  The size of the mass was originally 1.8 cm on ultrasound and now is up to 3.7 cm in greatest dimension. 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  Diagnostic ultrasound: [facility] 10/04/2023 FINDINGS: Targeted ultrasound is performed, again demonstrating the biopsy-proven bilobed fibroadenoma superficially at 9:30 o'clock currently measuring approximately 3.7 x 1.7 x 2.4 cm (previously 0.1 x 1.4 x 1.8 cm on 01/11/2021). The tribell shaped tissue marking clip placed at the time of biopsy on 01/18/2021 is visible within the  mass. IMPRESSION: Significant enlargement of the biopsy-proven fibroadenoma in the UPPER OUTER QUADRANT of the RIGHT breast since May, 2022, with measurements given above. RECOMMENDATION: 1. The patient wishes to have a surgical consultation to discuss excision of the progressively enlarging fibroadenoma. An appointment with [facility] will be made by the Nurse Navigator at the [facility]. 2. Screening mammogram at age 16 unless there are persistent or subsequent clinical concerns. (Code:[id number]) I have discussed the findings and recommendations with the patient. BI-RADS CATEGORY 2: Benign.  Pathology core needle biopsy: 01/18/2021 Breast, right, needle core biopsy, 9:30 o'clock - FIBROADENOMA. - NO EVIDENCE OF MALIGNANCY.  Review of Systems: A complete review of systems was obtained from the patient. I have reviewed this information and discussed as appropriate with the patient. See HPI as well for other ROS. ROS - o/w negative  Medical History: History reviewed. No pertinent past medical history.  Patient Active Problem List  Diagnosis  Mass of upper outer quadrant of right breast   Past Surgical History:  Procedure Laterality Date  kidney surgery    No Known Allergies  Current Outpatient Medications on File Prior to Visit  Medication Sig Dispense Refill  BLISOVI 24 FE 1 mg-20 mcg (24)/75 mg (4) tablet Take 1 tablet by mouth once daily   No current facility-administered medications on file prior to visit.   No family history on file.   Social History   Tobacco Use  Smoking Status Never  Smokeless Tobacco Never    Social History   Socioeconomic History  Marital status: Unknown  Tobacco Use  Smoking  status: Never  Smokeless tobacco: Never  Substance and Sexual Activity  Alcohol use: Not Currently  Drug use: Never   Social Drivers of Health   Housing Stability: Unknown (10/22/2023)  Housing Stability Vital Sign  Homeless in  the Last Year: No   Objective:   Vitals:  10/22/23 1034  BP: (!) 139/94  Pulse: 76  Temp: 36.7 C (98 F)  SpO2: 98%  Weight: 82.1 kg (181 lb)  Height: 177.8 cm (5\' 10" )   Body mass index is 25.97 kg/m.  Gen: No acute distress. Well nourished and well groomed.  Neurological: Alert and oriented to person, place, and time. Coordination normal.  Head: Normocephalic and atraumatic.  Eyes: Conjunctivae are normal. Pupils are equal, round, and reactive to light. No scleral icterus.  Neck: Normal range of motion. Neck supple. No tracheal deviation or thyromegaly present.  Cardiovascular: Normal rate, regular rhythm, normal heart sounds and intact distal pulses. Exam reveals no gallop and no friction rub. No murmur heard. Breast: right breast slightly larger. Mobile palpable mass near the skin 9:30 right breast. This is around 2-3 cm from the areolar border laterally. Non tender. Breasts relatively dense c/w age. No other masses. No LAD. No nipple retraction/nipple discharge or skin dimpling.  Respiratory: Effort normal. No respiratory distress. No chest wall tenderness. Breath sounds normal. No wheezes, rales or rhonchi.  GI: Soft. Bowel sounds are normal. The abdomen is soft and nontender. There is no rebound and no guarding.  Musculoskeletal: Normal range of motion. Extremities are nontender.  Lymphadenopathy: No cervical, preauricular, postauricular or axillary adenopathy is present Skin: Skin is warm and dry. No rash noted. No diaphoresis. No erythema. No pallor. No clubbing, cyanosis, or edema.  Psychiatric: Normal mood and affect. Behavior is normal. Judgment and thought content normal.   Labs N/a  Assessment and Plan:   ICD-10-CM  1. Mass of upper outer quadrant of right breast N63.11    Assessment & Plan Breast Mass Enlarging mass, initially identified in 2021, diagnosed as fibroadenoma in 2022. No associated pain or cyclical changes. On examination, mass is palpable.  Discussed the possibility of the mass transforming into a phyllodes tumor, which can be benign or malignant. -Plan for surgical excision of the mass. -Procedure will be under general anesthesia, with an estimated duration of one hour. -Postoperative care will include wearing a tight bra or breast binder for about a week. -Pathology results will be available a few days post-surgery. -If margins are positive, a re-excision may be necessary. -Check-up 2 weeks post-surgery to assess recovery and discuss pathology results.  General Health Maintenance -Continue regular work and daily activities as tolerated post-surgery. -Return to work is typically possible within a few days to a week post-surgery, depending on individual recovery. -Driving is typically possible within 2-5 days post-surgery, as long as patient is not taking narcotics and feels safe to drive. -Avoid heavy lifting for about a week post-surgery. -Can shower 2 days post-surgery, but avoid bathing, swimming, or hot tubs for about 2 weeks.  The surgical procedure was described to the patient. I discussed the incision type and location and that we would need radiology involved pre op to place a seed 1-2 days pre op.   We discussed the risks of wound complications like bleeding, infection, pain, need for further procedures/surgeries. We discussed the risk of seroma. The patient was advised that these are the most common complications, but that others can occur as well. I discussed the risk of alteration in breast contour  or size. I discussed risk of chronic pain. There are rare instances of heart/lung issues post op as well as blood clots.   They were advised against taking aspirin or other anti-inflammatory agents/blood thinners the week before surgery.   The risks and benefits of the procedure were described to the patient and she wishes to proceed.

## 2023-11-08 NOTE — Anesthesia Preprocedure Evaluation (Addendum)
 Anesthesia Evaluation  Patient identified by MRN, date of birth, ID band Patient awake    Reviewed: Allergy & Precautions, NPO status , Patient's Chart, lab work & pertinent test results  History of Anesthesia Complications Negative for: history of anesthetic complications  Airway Mallampati: II  TM Distance: >3 FB Neck ROM: Full    Dental no notable dental hx.    Pulmonary neg pulmonary ROS   Pulmonary exam normal        Cardiovascular negative cardio ROS Normal cardiovascular exam     Neuro/Psych negative neurological ROS     GI/Hepatic negative GI ROS, Neg liver ROS,,,  Endo/Other  negative endocrine ROS    Renal/GU negative Renal ROS  negative genitourinary   Musculoskeletal negative musculoskeletal ROS (+)    Abdominal   Peds  Hematology negative hematology ROS (+)   Anesthesia Other Findings Right breast mass  Reproductive/Obstetrics                             Anesthesia Physical Anesthesia Plan  ASA: 1  Anesthesia Plan: General   Post-op Pain Management: Tylenol PO (pre-op)*   Induction: Intravenous  PONV Risk Score and Plan: 3 and Treatment may vary due to age or medical condition, Ondansetron, Dexamethasone, Midazolam and Scopolamine patch - Pre-op  Airway Management Planned: LMA  Additional Equipment: None  Intra-op Plan:   Post-operative Plan: Extubation in OR  Informed Consent: I have reviewed the patients History and Physical, chart, labs and discussed the procedure including the risks, benefits and alternatives for the proposed anesthesia with the patient or authorized representative who has indicated his/her understanding and acceptance.     Dental advisory given  Plan Discussed with: CRNA  Anesthesia Plan Comments:        Anesthesia Quick Evaluation

## 2023-11-08 NOTE — Transfer of Care (Signed)
 Immediate Anesthesia Transfer of Care Note  Patient: Ashley Copeland  Procedure(s) Performed: EXCISION OF RIGHT BREAST MASS (Right: Breast)  Patient Location: PACU  Anesthesia Type:General  Level of Consciousness: sedated  Airway & Oxygen Therapy: Patient Spontanous Breathing and Patient connected to face mask oxygen  Post-op Assessment: Report given to RN and Post -op Vital signs reviewed and stable  Post vital signs: Reviewed and stable  Last Vitals:  Vitals Value Taken Time  BP    Temp    Pulse    Resp    SpO2      Last Pain:  Vitals:   11/08/23 0804  TempSrc: Temporal  PainSc: 0-No pain      Patients Stated Pain Goal: 5 (11/08/23 0804)  Complications: No notable events documented.

## 2023-11-09 ENCOUNTER — Encounter (HOSPITAL_BASED_OUTPATIENT_CLINIC_OR_DEPARTMENT_OTHER): Payer: Self-pay | Admitting: General Surgery

## 2023-11-09 LAB — SURGICAL PATHOLOGY

## 2023-11-10 ENCOUNTER — Encounter: Payer: Self-pay | Admitting: General Surgery

## 2023-12-29 ENCOUNTER — Ambulatory Visit
Admission: EM | Admit: 2023-12-29 | Discharge: 2023-12-29 | Disposition: A | Discharge status: Home / Self Care | Attending: Physician Assistant | Admitting: Physician Assistant

## 2023-12-29 ENCOUNTER — Other Ambulatory Visit: Payer: Self-pay

## 2023-12-29 DIAGNOSIS — U071 COVID-19: Secondary | ICD-10-CM | POA: Diagnosis not present

## 2023-12-29 LAB — POCT RAPID STREP A (OFFICE): Rapid Strep A Screen: NEGATIVE

## 2023-12-29 LAB — POC COVID19/FLU A&B COMBO
Covid Antigen, POC: POSITIVE — AB
Influenza A Antigen, POC: NEGATIVE
Influenza B Antigen, POC: NEGATIVE

## 2023-12-29 NOTE — ED Provider Notes (Signed)
 [facility]    CSN: [id number] Arrival date & time: 12/29/23  0818      History   Chief Complaint Chief Complaint  Patient presents with   Fever    HPI Ashley Copeland is a 28 y.o. female.   HPI  She reports have initial symptoms on Tuesday comprised of sore throat and myalgias She states this seemed to be getting better but last night she developed a low fever   She reports developing congestion on Wed and Thurs  She states other symptoms seemed to return Friday and last night  She did start taking Mucinex   Past Medical History:  Diagnosis Date   Breast mass in female     There are no active problems to display for this patient.   Past Surgical History:  Procedure Laterality Date   BREAST CYST EXCISION Right 11/08/2023   Procedure: EXCISION OF RIGHT BREAST MASS;  Surgeon: Lockie Rima, MD;  Location: [facility];  Service: General;  Laterality: Right;   ureter surgery     from pt report    OB History   No obstetric history on file.      Home Medications    Prior to Admission medications   Medication Sig Start Date End Date Taking? Authorizing Provider  norethindrone-ethinyl estradiol-FE (LOESTRIN FE) 1-20 MG-MCG tablet Take 1 tablet by mouth daily.    [provider]  ondansetron  (ZOFRAN ) 4 MG tablet Take 4 mg by mouth every 8 (eight) hours as needed for nausea or vomiting.    [provider]  oxyCODONE  (OXY IR/ROXICODONE ) 5 MG immediate release tablet Take 1-2 tablets (5-10 mg total) by mouth every 6 (six) hours as needed for severe pain (pain score 7-10). 11/08/23   Lockie Rima, MD    Family History History reviewed. No pertinent family history.  Social History Social History   Tobacco Use   Smoking status: Never   Smokeless tobacco: Never  Vaping Use   Vaping status: Never Used  Substance Use Topics   Alcohol use: Not Currently    Comment: "i dont really have any anymore"   Drug use: Never      Allergies   Patient has no known allergies.   Review of Systems Review of Systems  Constitutional:  Positive for chills, fatigue and fever.  HENT:  Positive for congestion, rhinorrhea, sinus pressure and sore throat.   Respiratory:  Negative for cough, shortness of breath and wheezing.   Gastrointestinal:  Negative for diarrhea, nausea and vomiting.  Musculoskeletal:  Positive for myalgias.     Physical Exam Triage Vital Signs ED Triage Vitals  Encounter Vitals Group     BP 12/29/23 0821 (!) 137/104     Systolic BP Percentile --      Diastolic BP Percentile --      Pulse Rate 12/29/23 0821 (!) 109     Resp 12/29/23 0821 16     Temp 12/29/23 0821 98.5 F (36.9 C)     Temp Source 12/29/23 0821 Oral     SpO2 12/29/23 0821 93 %     Weight 12/29/23 0821 180 lb (81.6 kg)     Height 12/29/23 0821 5' 10.5" (1.791 m)     Head Circumference --      Peak Flow --      Pain Score 12/29/23 0832 3     Pain Loc --      Pain Education --      Exclude from Growth Chart --  No data found.  Updated Vital Signs BP (!) 133/99 (BP Location: Right Arm)   Pulse 99   Temp 98.5 F (36.9 C) (Oral)   Resp 16   Ht 5' 10.5" (1.791 m)   Wt 180 lb (81.6 kg)   LMP 12/29/2023 (Exact Date)   SpO2 95%   BMI 25.46 kg/m   Visual Acuity Right Eye Distance:   Left Eye Distance:   Bilateral Distance:    Right Eye Near:   Left Eye Near:    Bilateral Near:     Physical Exam Vitals reviewed.  Constitutional:      General: She is awake.     Appearance: Normal appearance. She is well-developed and well-groomed.  HENT:     Head: Normocephalic and atraumatic.     Right Ear: Hearing, tympanic membrane and ear canal normal.     Left Ear: Hearing, tympanic membrane and ear canal normal.     Mouth/Throat:     Lips: Pink.     Mouth: Mucous membranes are moist.     Pharynx: Oropharynx is clear. Uvula midline. No pharyngeal swelling, oropharyngeal exudate, posterior oropharyngeal erythema,  uvula swelling or postnasal drip.  Eyes:     Extraocular Movements: Extraocular movements intact.     Conjunctiva/sclera: Conjunctivae normal.  Cardiovascular:     Rate and Rhythm: Normal rate and regular rhythm.     Pulses: Normal pulses.          Radial pulses are 2+ on the right side and 2+ on the left side.     Heart sounds: Normal heart sounds. No murmur heard.    No friction rub. No gallop.  Pulmonary:     Effort: Pulmonary effort is normal.     Breath sounds: Normal breath sounds. No decreased air movement. No decreased breath sounds, wheezing, rhonchi or rales.  Musculoskeletal:     Cervical back: Normal range of motion and neck supple.  Lymphadenopathy:     Head:     Right side of head: No submental, submandibular or preauricular adenopathy.     Left side of head: No submental, submandibular or preauricular adenopathy.     Cervical:     Right cervical: No superficial cervical adenopathy.    Left cervical: No superficial cervical adenopathy.     Upper Body:     Right upper body: No supraclavicular adenopathy.     Left upper body: No supraclavicular adenopathy.  Skin:    General: Skin is warm and dry.  Neurological:     General: No focal deficit present.     Mental Status: She is alert and oriented to person, place, and time.  Psychiatric:        Mood and Affect: Mood normal.        Behavior: Behavior normal. Behavior is cooperative.        Thought Content: Thought content normal.        Judgment: Judgment normal.      UC Treatments / Results  Labs (all labs ordered are listed, but only abnormal results are displayed) Labs Reviewed  POC COVID19/FLU A&B COMBO - Abnormal; Notable for the following components:      Result Value   Covid Antigen, POC Positive (*)    All other components within normal limits  POCT RAPID STREP A (OFFICE)    EKG   Radiology No results found.  Procedures Procedures (including critical care time)  Medications Ordered in  UC Medications - No data to display  Initial Impression /  Assessment and Plan / UC Course  I have reviewed the triage vital signs and the nursing notes.  Pertinent labs & imaging results that were available during my care of the patient were reviewed by me and considered in my medical decision making (see chart for details).      Final Clinical Impressions(s) / UC Diagnoses   Final diagnoses:  COVID-19   Patient presents today with concerns for body aches, fatigue, sore throat, subjective fever, chills this been ongoing since Tuesday.  She reports that symptoms initially seem to be getting better but then yesterday worsening and more severe symptoms.  Rapid strep and flu test were negative, COVID test was positive.  Physical exam and vitals are overall reassuring at this time.  Reviewed results of testing with patient and discussed that since symptoms have technically been ongoing since Tuesday she is on day 5 meaning that antiviral medications will likely not provide much benefit.  Recommend continued use of over-the-counter medications for symptomatic relief.  Reviewed quarantine and masking precautions to help prevent further transmission.  ED and return precautions reviewed and provided in after visit summary.  Follow-up as needed.     Discharge Instructions      Your COVID and flu testing was positive for COVID. Your strep testing was negative.   Symptoms can last for 3-10 days with lingering cough and intermittent symptoms potentially  lasting several  weeks after that.  The goal of treatment at this time is to reduce your symptoms and discomfort   You can use the following medications and measures to help yourself feel better until your body fights this off: DayQuil/NyQuil, TheraFlu, Alka-Seltzer  (these medications typically have the same active ingredients in them so you can choose whichever one you prefer and take consistently during the day and night according to the  manufactures instructions.) Flonase A daily antihistamine such as Zyrtec, Claritin, Allegra per your preference.  Please choose 1 and take consistently. Increased fluids.  It is recommended that you take in at least 64 ounces of water per day when you are not sick so it is important to increase this when you are sick and your body may be running fever. Rest Cough drops Chloraseptic throat spray to help with sore throat Nasal saline spray or nasal flushes to help with congestion and runny nose  It is typically recommended that the first 5 days of your symptoms you try to quarantine and isolate yourself from others.  The following 5 days you can go out and about and be around other people but please wear a mask and try to practice social distancing as best as possible to help prevent further transmission.  If your symptoms seem like they are getting worse over the next 5 to 7 days or not improving you can always follow-up here in urgent care or go to your primary care provider for further management. Go to the ER if you begin to have more serious symptoms such as shortness of breath, trouble breathing, loss of consciousness, swelling around the eyes, high fever, severe lasting headaches, vision changes or neck pain/stiffness, swelling in one of your extremities, chest pain.     ED Prescriptions   None    PDMP not reviewed this encounter.   Jerona Mooring, PA-C 12/29/23 1478

## 2023-12-29 NOTE — ED Triage Notes (Signed)
 Pt presents with complaints of a low grade fever (99.3 F) last night 5/2. Pt states her symptoms began on Tuesday of this week (4/29). Started with a sore throat and generalized body aches. These symptoms are still present. At-home COVID test taken Tuesday morning and it was negative. OTC Tylenol  + one dose of Mucinex taken with minimal improvement.

## 2023-12-29 NOTE — Discharge Instructions (Addendum)
 Your COVID and flu testing was positive for COVID. Your strep testing was negative.   Symptoms can last for 3-10 days with lingering cough and intermittent symptoms potentially  lasting several  weeks after that.  The goal of treatment at this time is to reduce your symptoms and discomfort   You can use the following medications and measures to help yourself feel better until your body fights this off: DayQuil/NyQuil, TheraFlu, Alka-Seltzer  (these medications typically have the same active ingredients in them so you can choose whichever one you prefer and take consistently during the day and night according to the manufactures instructions.) Flonase A daily antihistamine such as Zyrtec, Claritin, Allegra per your preference.  Please choose 1 and take consistently. Increased fluids.  It is recommended that you take in at least 64 ounces of water per day when you are not sick so it is important to increase this when you are sick and your body may be running fever. Rest Cough drops Chloraseptic throat spray to help with sore throat Nasal saline spray or nasal flushes to help with congestion and runny nose  It is typically recommended that the first 5 days of your symptoms you try to quarantine and isolate yourself from others.  The following 5 days you can go out and about and be around other people but please wear a mask and try to practice social distancing as best as possible to help prevent further transmission.  If your symptoms seem like they are getting worse over the next 5 to 7 days or not improving you can always follow-up here in urgent care or go to your primary care provider for further management. Go to the ER if you begin to have more serious symptoms such as shortness of breath, trouble breathing, loss of consciousness, swelling around the eyes, high fever, severe lasting headaches, vision changes or neck pain/stiffness, swelling in one of your extremities, chest pain.
